# Patient Record
Sex: Male | Born: 1975 | Race: White | Hispanic: No | Marital: Married | Smoking: Never smoker
Health system: Southern US, Community
[De-identification: ages and names within clinical notes are randomized; demographics above are authoritative.]

## PROBLEM LIST (undated history)

## (undated) DIAGNOSIS — J45909 Unspecified asthma, uncomplicated: Secondary | ICD-10-CM

---

## 2017-04-20 ENCOUNTER — Emergency Department (INDEPENDENT_AMBULATORY_CARE_PROVIDER_SITE_OTHER): Payer: Commercial Managed Care - PPO

## 2017-04-20 ENCOUNTER — Encounter: Payer: Self-pay | Admitting: Emergency Medicine

## 2017-04-20 ENCOUNTER — Emergency Department (INDEPENDENT_AMBULATORY_CARE_PROVIDER_SITE_OTHER)
Admission: EM | Admit: 2017-04-20 | Discharge: 2017-04-20 | Disposition: A | Discharge status: Home / Self Care | Payer: Commercial Managed Care - PPO | Source: Home / Self Care | Attending: Family Medicine | Admitting: Family Medicine

## 2017-04-20 DIAGNOSIS — M7050 Other bursitis of knee, unspecified knee: Secondary | ICD-10-CM

## 2017-04-20 DIAGNOSIS — M25561 Pain in right knee: Secondary | ICD-10-CM

## 2017-04-20 HISTORY — DX: Unspecified asthma, uncomplicated: J45.909

## 2017-04-20 MED ORDER — TRAMADOL HCL 50 MG PO TABS: 50.0000 mg | ORAL_TABLET | Freq: Four times a day (QID) | ORAL | 0 refills | Status: AC | PRN

## 2017-04-20 MED ORDER — MELOXICAM 15 MG PO TABS: 15.0000 mg | ORAL_TABLET | Freq: Every day | ORAL | 0 refills | Status: AC

## 2017-04-20 MED ORDER — PREDNISONE 20 MG PO TABS: ORAL_TABLET | ORAL | 0 refills | Status: AC

## 2017-04-20 NOTE — Discharge Instructions (Signed)
Continue to wear knee sleeve daytime.  Apply ice pack for 20 to 30 minutes, 3 to 4 times daily  Continue until pain and swelling decrease.  Begin range of motion and stretching exercises as tolerated.  May begin Mobic (meloxicam) after finishing prednisone.

## 2017-04-20 NOTE — ED Provider Notes (Signed)
[facility]    CSN: [id number] Arrival date & time: 04/20/17  1458     History   Chief Complaint Chief Complaint  Patient presents with  . Leg Pain    HPI Jeremy Davis is a 41 y.o. male.   Patient reports that he has had mild difficulty fully flexing his right knee for several months.  Four days ago after a long trip returning from [place] he felt increased soreness in his right knee.  The next day while exercising he felt a popping sensation in his right knee with increased pain, especially at night.     The history is provided by the patient.  Knee Pain  Location:  Knee Time since incident:  4 days Injury: no   Knee location:  R knee Pain details:    Quality:  Aching   Radiates to:  Does not radiate   Severity:  Moderate   Onset quality:  Gradual   Duration:  4 days   Timing:  Constant   Progression:  Worsening Chronicity:  New Dislocation: no   Prior injury to area:  No Relieved by:  Nothing Worsened by:  Flexion Ineffective treatments:  NSAIDs Associated symptoms: decreased ROM, stiffness and swelling   Associated symptoms: no back pain, no fatigue, no fever, no muscle weakness and no numbness     Past Medical History:  Diagnosis Date  . Asthma     There are no active problems to display for this patient.   History reviewed. No pertinent surgical history.     Home Medications    Prior to Admission medications   Medication Sig Start Date End Date Taking? Authorizing Provider  meloxicam (MOBIC) 15 MG tablet Take 1 tablet (15 mg total) by mouth daily. Take with food each evening. 04/20/17   Lattie Haw, MD  predniSONE (DELTASONE) 20 MG tablet Take one tab by mouth twice daily for 5 days, then one daily. Take with food. 04/20/17   Lattie Haw, MD  traMADol (ULTRAM) 50 MG tablet Take 1 tablet (50 mg total) by mouth every 6 (six) hours as needed for moderate pain. 04/20/17   Lattie Haw, MD    Family History History reviewed. No  pertinent family history.  Social History Social History  Substance Use Topics  . Smoking status: Never Smoker  . Smokeless tobacco: Never Used  . Alcohol use No     Allergies   Patient has no allergy information on record.   Review of Systems Review of Systems  Constitutional: Negative for fatigue and fever.  Musculoskeletal: Positive for stiffness. Negative for back pain.     Physical Exam Triage Vital Signs ED Triage Vitals  Enc Vitals Group     BP 04/20/17 1537 109/69     Pulse Rate 04/20/17 1537 (!) 50     Resp --      Temp 04/20/17 1537 98 F (36.7 C)     Temp Source 04/20/17 1537 Oral     SpO2 04/20/17 1537 98 %     Weight 04/20/17 1537 200 lb (90.7 kg)     Height --      Head Circumference --      Peak Flow --      Pain Score 04/20/17 1538 9     Pain Loc --      Pain Edu? --      Excl. in GC? --    No data found.   Updated Vital Signs BP 109/69 (BP  Location: Right Arm)   Pulse (!) 50 Comment: pt states this is his normal  Temp 98 F (36.7 C) (Oral)   Wt 200 lb (90.7 kg)   SpO2 98%   Visual Acuity Right Eye Distance:   Left Eye Distance:   Bilateral Distance:    Right Eye Near:   Left Eye Near:    Bilateral Near:     Physical Exam  Constitutional: He appears well-developed and well-nourished. No distress.  HENT:  Head: Normocephalic.  Eyes: Pupils are equal, round, and reactive to light.  Neck: Normal range of motion.  Cardiovascular: Normal rate.   Pulmonary/Chest: Effort normal.  Musculoskeletal: He exhibits no edema.       Right shoulder: He exhibits decreased range of motion, tenderness and bony tenderness. He exhibits no swelling and no deformity.       Legs: Right knee:  No effusion, erythema, or warmth.  Knee stable, negative drawer test.  McMurray test negative.  Patient has pain during full extension of knee. Tenderness over pes anserine bursa right knee  Neurological: He is alert.  Skin: Skin is warm and dry.  Nursing note  and vitals reviewed.    UC Treatments / Results  Labs (all labs ordered are listed, but only abnormal results are displayed) Labs Reviewed - No data to display  EKG  EKG Interpretation None       Radiology Dg Knee Complete 4 Views Right  Result Date: 04/20/2017 CLINICAL DATA:  41 year old male with right knee pain and swelling after hearing a pop while exercising 4 days ago. EXAM: RIGHT KNEE - COMPLETE 4+ VIEW COMPARISON:  None. FINDINGS: Small suprapatellar joint effusion suspected. Normal joint spaces and alignment. Bone mineralization is within normal limits. Patella intact. No osseous abnormality identified. IMPRESSION: Small joint effusion suspected but no osseous abnormality identified at the right knee. Electronically Signed   By: Odessa FlemingH  Hall M.D.   On: 04/20/2017 16:36    Procedures Procedures (including critical care time)  Medications Ordered in UC Medications - No data to display   Initial Impression / Assessment and Plan / UC Course  I have reviewed the triage vital signs and the nursing notes.  Pertinent labs & imaging results that were available during my care of the patient were reviewed by me and considered in my medical decision making (see chart for details).    Begin prednisone burst/taper.  Tramadol for pain. Continue to wear knee sleeve daytime.  Apply ice pack for 20 to 30 minutes, 3 to 4 times daily  Continue until pain and swelling decrease.  Begin range of motion and stretching exercises as tolerated.  May begin Mobic (meloxicam) after finishing prednisone. Followup with Dr. Rodney Langtonhomas Thekkekandam or Dr. Clementeen GrahamEvan Corey ([facility] if not improving about two weeks.     Final Clinical Impressions(s) / UC Diagnoses   Final diagnoses:  Pes anserine bursitis    New Prescriptions New Prescriptions   MELOXICAM (MOBIC) 15 MG TABLET    Take 1 tablet (15 mg total) by mouth daily. Take with food each evening.   PREDNISONE (DELTASONE) 20 MG TABLET     Take one tab by mouth twice daily for 5 days, then one daily. Take with food.   TRAMADOL (ULTRAM) 50 MG TABLET    Take 1 tablet (50 mg total) by mouth every 6 (six) hours as needed for moderate pain.     Lattie HawBeese, Kayan Blissett A, MD 04/20/17 [phone]

## 2017-04-20 NOTE — ED Triage Notes (Signed)
Pt c/o right knee pain and swelling x3 days. Getting worse and denies injury. Using ice ace wrap and advil.

## 2020-12-08 DIAGNOSIS — U071 COVID-19: Secondary | ICD-10-CM | POA: Diagnosis not present

## 2020-12-17 DIAGNOSIS — K625 Hemorrhage of anus and rectum: Secondary | ICD-10-CM | POA: Diagnosis not present

## 2021-01-02 DIAGNOSIS — K625 Hemorrhage of anus and rectum: Secondary | ICD-10-CM | POA: Diagnosis not present

## 2021-01-23 DIAGNOSIS — Z6830 Body mass index (BMI) 30.0-30.9, adult: Secondary | ICD-10-CM | POA: Diagnosis not present

## 2021-01-23 DIAGNOSIS — Z0000 Encounter for general adult medical examination without abnormal findings: Secondary | ICD-10-CM | POA: Diagnosis not present

## 2021-01-23 DIAGNOSIS — Z13220 Encounter for screening for lipoid disorders: Secondary | ICD-10-CM | POA: Diagnosis not present

## 2021-01-23 DIAGNOSIS — J4520 Mild intermittent asthma, uncomplicated: Secondary | ICD-10-CM | POA: Diagnosis not present

## 2021-01-23 DIAGNOSIS — K625 Hemorrhage of anus and rectum: Secondary | ICD-10-CM | POA: Diagnosis not present

## 2021-01-31 ENCOUNTER — Other Ambulatory Visit: Payer: Self-pay

## 2021-01-31 ENCOUNTER — Other Ambulatory Visit: Payer: Self-pay | Admitting: Family Medicine

## 2021-01-31 ENCOUNTER — Ambulatory Visit (INDEPENDENT_AMBULATORY_CARE_PROVIDER_SITE_OTHER): Payer: BC Managed Care – PPO

## 2021-01-31 DIAGNOSIS — K625 Hemorrhage of anus and rectum: Secondary | ICD-10-CM | POA: Diagnosis not present

## 2021-01-31 DIAGNOSIS — K760 Fatty (change of) liver, not elsewhere classified: Secondary | ICD-10-CM | POA: Diagnosis not present

## 2021-01-31 DIAGNOSIS — R109 Unspecified abdominal pain: Secondary | ICD-10-CM | POA: Diagnosis not present

## 2021-02-11 DIAGNOSIS — K648 Other hemorrhoids: Secondary | ICD-10-CM | POA: Diagnosis not present

## 2021-02-11 DIAGNOSIS — K921 Melena: Secondary | ICD-10-CM | POA: Diagnosis not present

## 2021-05-28 DIAGNOSIS — I701 Atherosclerosis of renal artery: Secondary | ICD-10-CM | POA: Diagnosis not present

## 2021-05-28 DIAGNOSIS — E785 Hyperlipidemia, unspecified: Secondary | ICD-10-CM | POA: Diagnosis not present

## 2021-05-28 DIAGNOSIS — I150 Renovascular hypertension: Secondary | ICD-10-CM | POA: Diagnosis not present

## 2021-05-28 DIAGNOSIS — Z01818 Encounter for other preprocedural examination: Secondary | ICD-10-CM | POA: Diagnosis not present

## 2021-06-05 DIAGNOSIS — Z79899 Other long term (current) drug therapy: Secondary | ICD-10-CM | POA: Diagnosis not present

## 2021-06-05 DIAGNOSIS — R002 Palpitations: Secondary | ICD-10-CM | POA: Diagnosis not present

## 2021-06-05 DIAGNOSIS — Z8249 Family history of ischemic heart disease and other diseases of the circulatory system: Secondary | ICD-10-CM | POA: Diagnosis not present

## 2021-06-05 DIAGNOSIS — I10 Essential (primary) hypertension: Secondary | ICD-10-CM | POA: Diagnosis not present

## 2021-06-05 DIAGNOSIS — I773 Arterial fibromuscular dysplasia: Secondary | ICD-10-CM | POA: Diagnosis not present

## 2021-06-05 DIAGNOSIS — I701 Atherosclerosis of renal artery: Secondary | ICD-10-CM | POA: Diagnosis not present

## 2021-06-14 DIAGNOSIS — Z95820 Peripheral vascular angioplasty status with implants and grafts: Secondary | ICD-10-CM | POA: Diagnosis not present

## 2021-06-14 DIAGNOSIS — I701 Atherosclerosis of renal artery: Secondary | ICD-10-CM | POA: Diagnosis not present

## 2021-06-26 DIAGNOSIS — J0140 Acute pansinusitis, unspecified: Secondary | ICD-10-CM | POA: Diagnosis not present

## 2021-06-26 DIAGNOSIS — G43919 Migraine, unspecified, intractable, without status migrainosus: Secondary | ICD-10-CM | POA: Diagnosis not present

## 2021-07-23 DIAGNOSIS — I701 Atherosclerosis of renal artery: Secondary | ICD-10-CM | POA: Diagnosis not present

## 2021-07-23 DIAGNOSIS — I150 Renovascular hypertension: Secondary | ICD-10-CM | POA: Diagnosis not present

## 2021-07-23 DIAGNOSIS — E785 Hyperlipidemia, unspecified: Secondary | ICD-10-CM | POA: Diagnosis not present

## 2021-08-05 DIAGNOSIS — L309 Dermatitis, unspecified: Secondary | ICD-10-CM | POA: Diagnosis not present

## 2021-11-05 DIAGNOSIS — K649 Unspecified hemorrhoids: Secondary | ICD-10-CM | POA: Diagnosis not present

## 2021-11-05 DIAGNOSIS — K625 Hemorrhage of anus and rectum: Secondary | ICD-10-CM | POA: Diagnosis not present

## 2021-11-29 DIAGNOSIS — K641 Second degree hemorrhoids: Secondary | ICD-10-CM | POA: Diagnosis not present

## 2021-12-18 DIAGNOSIS — K641 Second degree hemorrhoids: Secondary | ICD-10-CM | POA: Diagnosis not present

## 2022-01-07 DIAGNOSIS — K641 Second degree hemorrhoids: Secondary | ICD-10-CM | POA: Diagnosis not present

## 2022-01-24 DIAGNOSIS — Z23 Encounter for immunization: Secondary | ICD-10-CM | POA: Diagnosis not present

## 2022-01-24 DIAGNOSIS — E785 Hyperlipidemia, unspecified: Secondary | ICD-10-CM | POA: Diagnosis not present

## 2022-01-24 DIAGNOSIS — J4520 Mild intermittent asthma, uncomplicated: Secondary | ICD-10-CM | POA: Diagnosis not present

## 2022-01-24 DIAGNOSIS — K648 Other hemorrhoids: Secondary | ICD-10-CM | POA: Diagnosis not present

## 2022-01-24 DIAGNOSIS — Z0000 Encounter for general adult medical examination without abnormal findings: Secondary | ICD-10-CM | POA: Diagnosis not present

## 2022-03-19 DIAGNOSIS — K625 Hemorrhage of anus and rectum: Secondary | ICD-10-CM | POA: Diagnosis not present

## 2022-03-19 DIAGNOSIS — K648 Other hemorrhoids: Secondary | ICD-10-CM | POA: Diagnosis not present

## 2022-04-30 DIAGNOSIS — K219 Gastro-esophageal reflux disease without esophagitis: Secondary | ICD-10-CM | POA: Diagnosis not present

## 2022-04-30 DIAGNOSIS — K641 Second degree hemorrhoids: Secondary | ICD-10-CM | POA: Diagnosis not present

## 2022-10-06 IMAGING — US US ABDOMEN LIMITED RUQ/ASCITES
1 series · 14 of 25 positions shown · non-contrast
Comparison: None.

CLINICAL DATA: Right side abdominal pain

EXAM:
ULTRASOUND ABDOMEN LIMITED RIGHT UPPER QUADRANT

[Series 1: us abdomen limited ruq/ascites · 0.24mm/px · 14 of 62 slices shown]
[im 1/62]
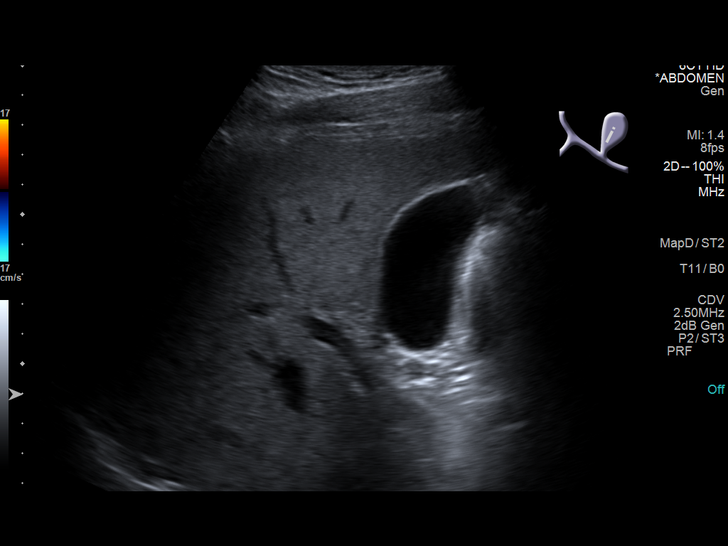
[im 6/62]
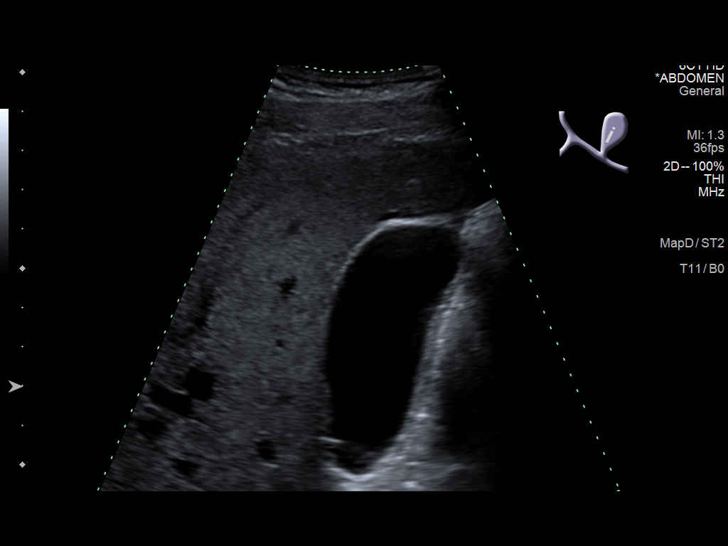
[im 11/62]
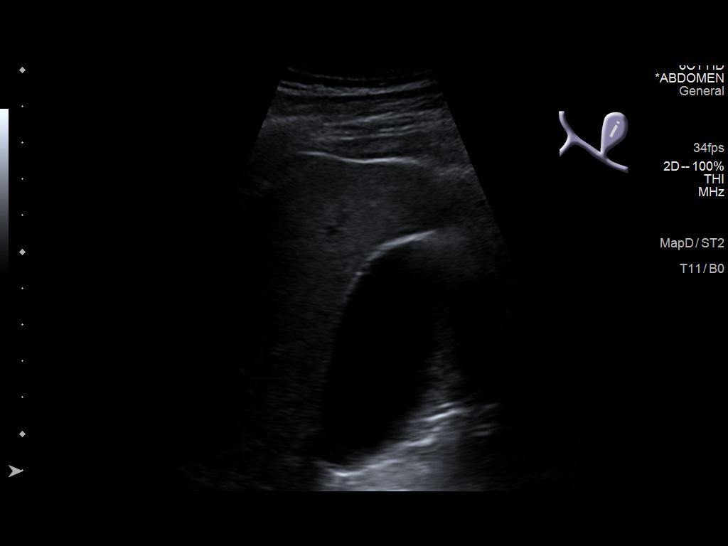
[im 16/62]
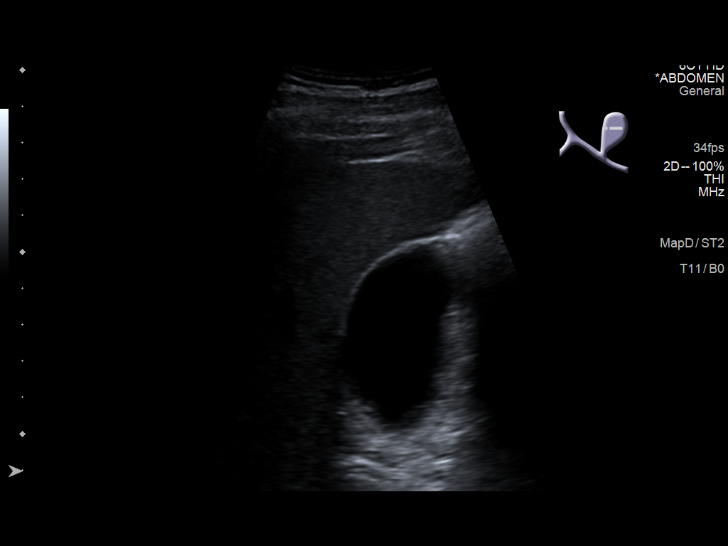
[im 21/62]
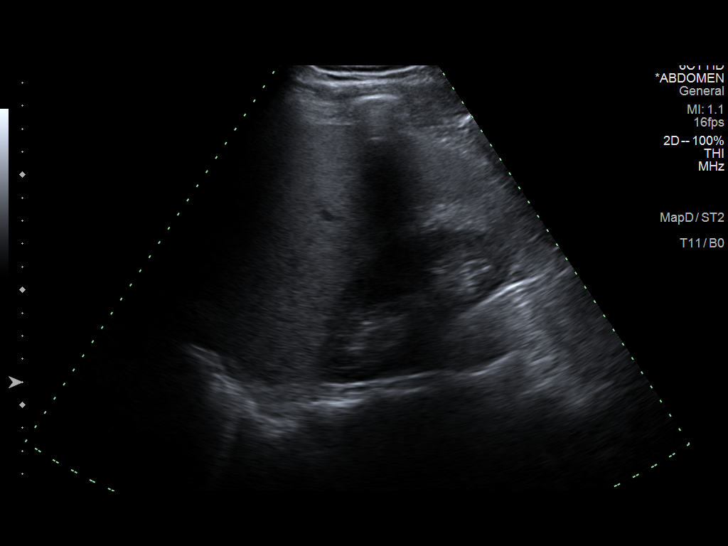
[im 23/62]
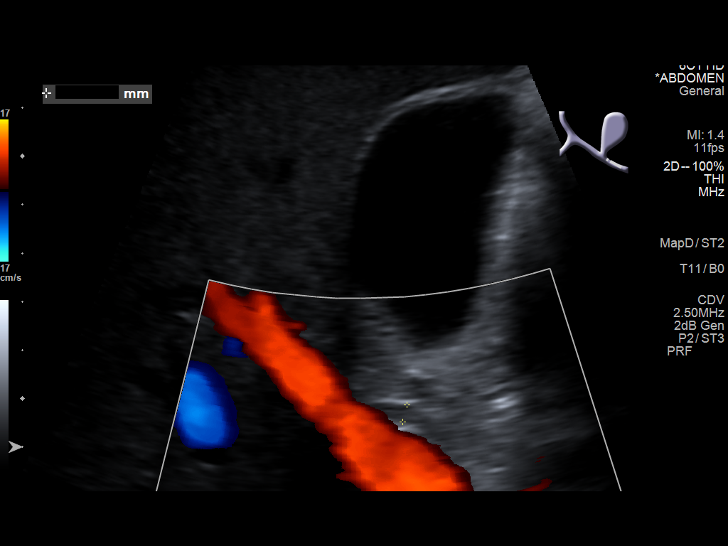
[im 28/62]
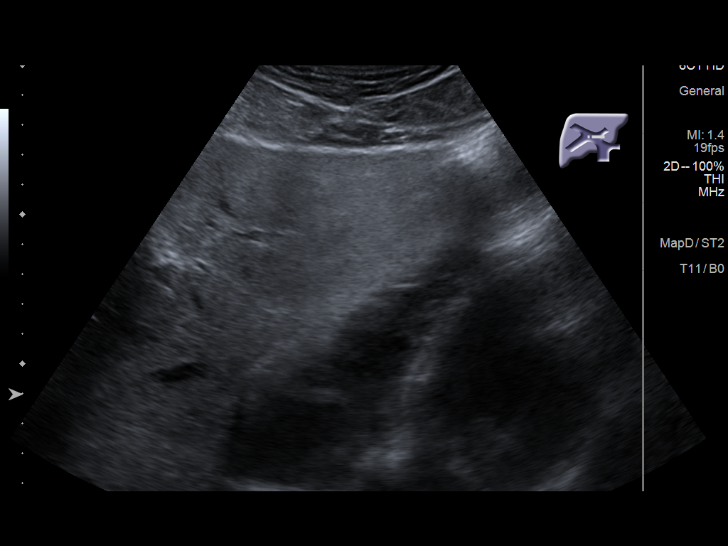
[im 34/62]
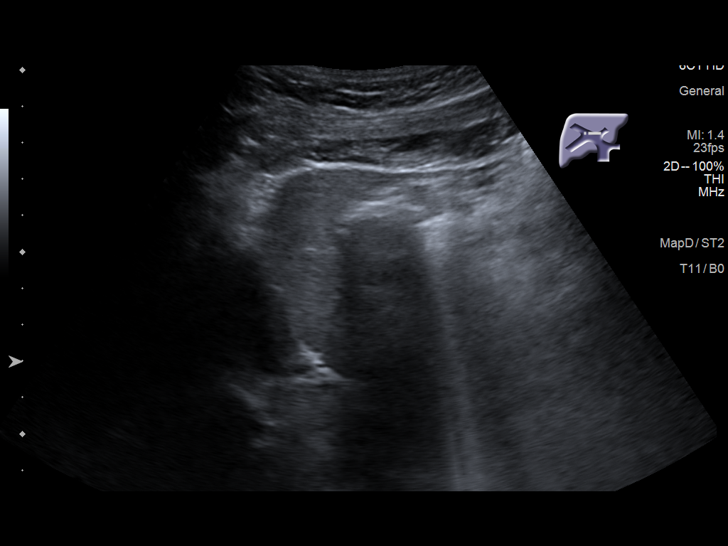
[im 39/62]
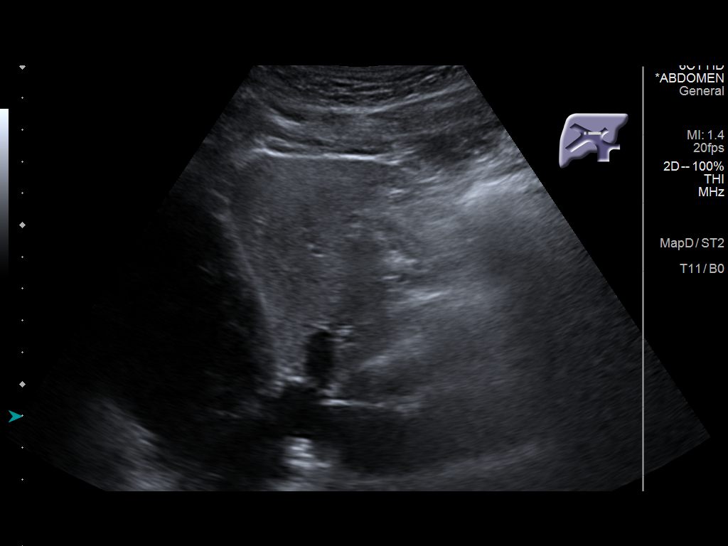
[im 41/62]
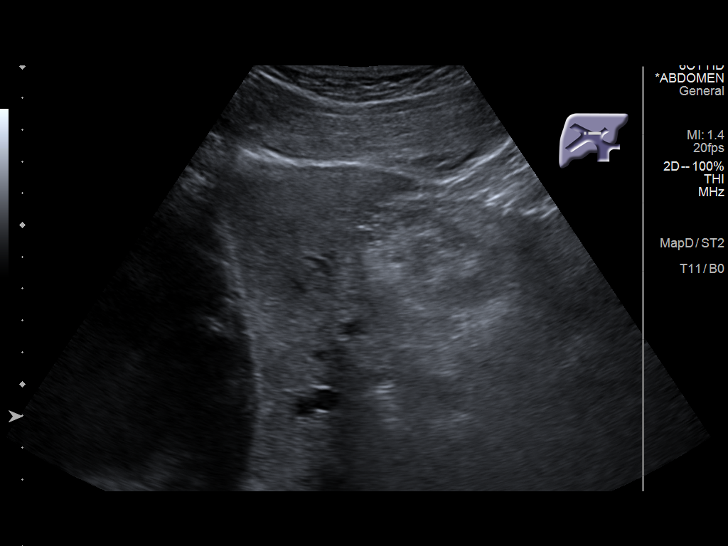
[im 46/62]
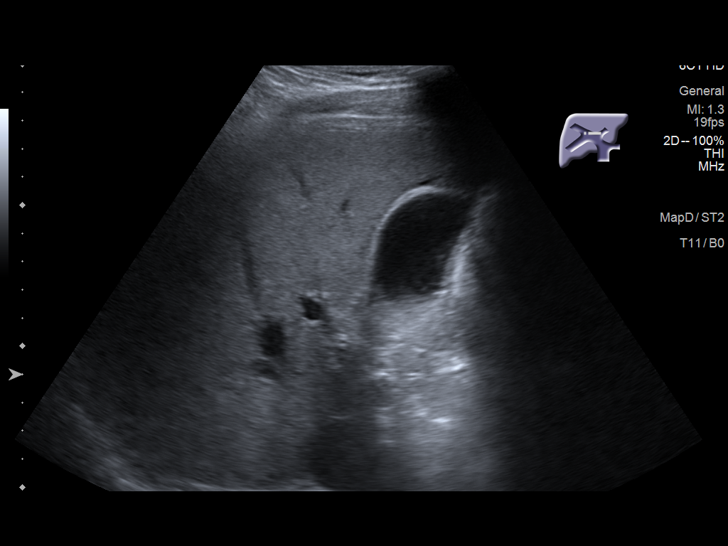
[im 51/62]
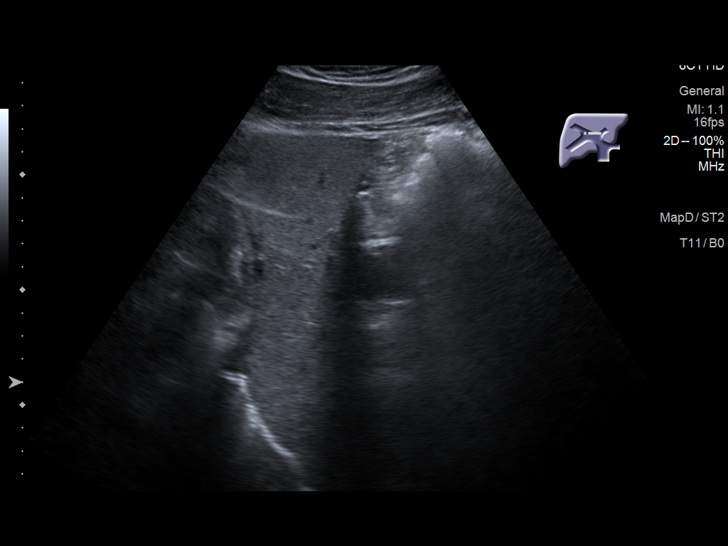
[im 56/62]
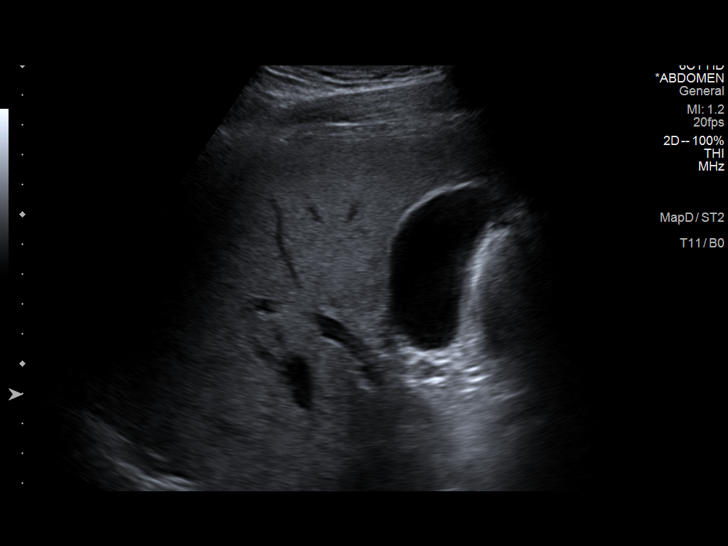
[im 62/62]
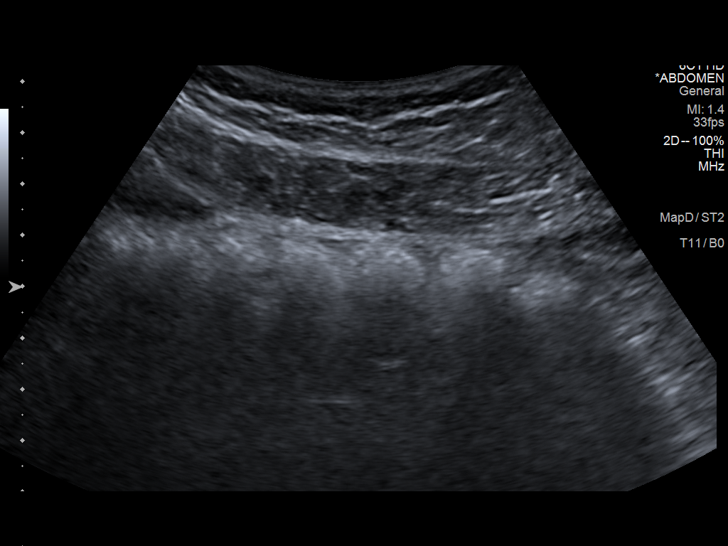

[14 of 25 positions shown; findings below may reference images not displayed]

FINDINGS: Gallbladder:

No gallstones or wall thickening visualized. No sonographic Murphy
sign noted by sonographer.

Common bile duct:

Diameter: Normal caliber, 4 mm

Liver:

Increased echotexture compatible with fatty infiltration. No focal
abnormality or biliary ductal dilatation. Portal vein is patent on
color Doppler imaging with normal direction of blood flow towards
the liver.

Other: Ultrasound in the area of pain in the right abdomen
demonstrates no visible abnormality. Bowel gas noted.
IMPRESSION: Hepatic steatosis.

No acute findings.
